# Patient Record
Sex: Male | Born: 1940 | Race: White | Hispanic: No | Marital: Married | Smoking: Former smoker
Health system: Southern US, Community
[De-identification: ages and names within clinical notes are randomized; demographics above are authoritative.]

## PROBLEM LIST (undated history)

## (undated) DIAGNOSIS — M5136 Other intervertebral disc degeneration, lumbar region: Secondary | ICD-10-CM

## (undated) DIAGNOSIS — E7800 Pure hypercholesterolemia, unspecified: Secondary | ICD-10-CM

## (undated) DIAGNOSIS — K759 Inflammatory liver disease, unspecified: Secondary | ICD-10-CM

## (undated) DIAGNOSIS — D649 Anemia, unspecified: Secondary | ICD-10-CM

## (undated) DIAGNOSIS — I10 Essential (primary) hypertension: Secondary | ICD-10-CM

## (undated) DIAGNOSIS — K922 Gastrointestinal hemorrhage, unspecified: Secondary | ICD-10-CM

## (undated) DIAGNOSIS — M51369 Other intervertebral disc degeneration, lumbar region without mention of lumbar back pain or lower extremity pain: Secondary | ICD-10-CM

## (undated) DIAGNOSIS — Z974 Presence of external hearing-aid: Secondary | ICD-10-CM

## (undated) HISTORY — PX: CHOLECYSTECTOMY: SHX55

## (undated) HISTORY — DX: Inflammatory liver disease, unspecified: K75.9

## (undated) HISTORY — DX: Essential (primary) hypertension: I10

## (undated) HISTORY — DX: Pure hypercholesterolemia, unspecified: E78.00

## (undated) HISTORY — DX: Anemia, unspecified: D64.9

---

## 2004-05-19 ENCOUNTER — Inpatient Hospital Stay: Payer: Self-pay | Admitting: Gastroenterology

## 2011-12-07 ENCOUNTER — Encounter: Payer: Self-pay | Admitting: *Deleted

## 2011-12-08 ENCOUNTER — Ambulatory Visit (INDEPENDENT_AMBULATORY_CARE_PROVIDER_SITE_OTHER): Payer: Medicare Other | Admitting: Internal Medicine

## 2011-12-08 ENCOUNTER — Encounter: Payer: Self-pay | Admitting: Internal Medicine

## 2011-12-08 VITALS — BP 143/84 | HR 69 | Temp 98.3°F | Resp 18 | Ht 69.0 in | Wt 190.0 lb

## 2011-12-08 DIAGNOSIS — E7800 Pure hypercholesterolemia, unspecified: Secondary | ICD-10-CM

## 2011-12-08 DIAGNOSIS — I10 Essential (primary) hypertension: Secondary | ICD-10-CM

## 2011-12-08 DIAGNOSIS — G4730 Sleep apnea, unspecified: Secondary | ICD-10-CM

## 2011-12-08 DIAGNOSIS — D649 Anemia, unspecified: Secondary | ICD-10-CM

## 2011-12-08 NOTE — Patient Instructions (Addendum)
It was good seeing you today.  I am glad you have been doing well.  Monitor your blood pressures for me and send them to me in 3-4 weeks.

## 2011-12-25 ENCOUNTER — Encounter: Payer: Self-pay | Admitting: Internal Medicine

## 2011-12-25 DIAGNOSIS — I10 Essential (primary) hypertension: Secondary | ICD-10-CM | POA: Insufficient documentation

## 2011-12-25 DIAGNOSIS — D649 Anemia, unspecified: Secondary | ICD-10-CM | POA: Insufficient documentation

## 2011-12-25 DIAGNOSIS — G4730 Sleep apnea, unspecified: Secondary | ICD-10-CM | POA: Insufficient documentation

## 2011-12-25 NOTE — Assessment & Plan Note (Signed)
Has had extensive GI w/up.  Will obtain records.  On no iron.  Follow cbc.  Hgb has been wnl recently.     

## 2011-12-25 NOTE — Assessment & Plan Note (Signed)
Blood pressure still elevated above goal.  Discussed adjusting his medication.  He declines.  Wants to work on diet and exercise.  Check blood pressures.   Check metabolic panel.  Follow.

## 2011-12-25 NOTE — Assessment & Plan Note (Signed)
Has CPAP.  Continue.    

## 2011-12-25 NOTE — Progress Notes (Signed)
  Subjective:    Patient ID: Jared Cooper, male    DOB: 1940/12/29, 71 y.o.   MRN: [id number]  HPI 71 year old male with past history of hypertension, anemia, reoccurring GI bleeds and hepatitis.  Comes in today for a scheduled follow up.  States he has felt good.  Blood pressures averaging 140-150/60-70s.  Not checking it recently.  Not exercising regularly and not watching what he eats.  No bleeding.  Bowels - no problems reported.  Stays active.    Past Medical History  Diagnosis Date  . Hypertension   . Anemia   . Hypercholesterolemia   . Hepatitis     diagnosed 1970, hospitalized    Outpatient Encounter Prescriptions as of 12/08/2011  Medication Sig Dispense Refill  . amLODipine (NORVASC) 5 MG tablet Take 5 mg by mouth daily.      . cyclobenzaprine (FLEXERIL) 5 MG tablet Take 5 mg by mouth 2 (two) times daily as needed. Every  eight hours as needed      . losartan-hydrochlorothiazide (HYZAAR) 50-12.5 MG per tablet Take 1 tablet by mouth daily.        Review of Systems Patient denies any headache, lightheadedness or dizziness.  No significant sinus symptoms. No chest pain, tightness or palpitations.  No increased shortness of breath, cough or congestion.  No nausea or vomiting.  No increased acid reflux.  No abdominal pain or cramping.  No bowel change, such as diarrhea, constipation, BRBPR or melana.  No urine change.        Objective:   Physical Exam Filed Vitals:   12/08/11 1131  BP: 143/84  Pulse: 69  Temp: 98.3 F (36.8 C)  Resp: 81   71 year old male in no acute distress.   HEENT:  Nares - clear.  OP- without lesions or erythema.  NECK:  Supple, nontender.  No audible bruit.   HEART:  Appears to be regular. LUNGS:  Without crackles or wheezing audible.  Respirations even and unlabored.   RADIAL PULSE:  Equal bilaterally.  ABDOMEN:  Soft, nontender.  No audible abdominal bruit.   EXTREMITIES:  No increased edema to be present.                     Assessment &  Plan:  CARDIOVASCULAR.  Asymptomatic.    MSK.  Some intermittent back issues.  Occasionally takes Flexeril.  Currently doing well.  Follow.    HEALTH MAINTENANCE.  Physical 12/21/10.  Has had extensive GI w/up.  Check PSA when due.

## 2011-12-26 ENCOUNTER — Other Ambulatory Visit: Payer: Self-pay | Admitting: General Practice

## 2011-12-26 MED ORDER — AMLODIPINE BESYLATE 5 MG PO TABS: 5.0000 mg | ORAL_TABLET | Freq: Every day | ORAL | Status: DC

## 2012-03-17 ENCOUNTER — Other Ambulatory Visit: Payer: Self-pay

## 2012-03-28 ENCOUNTER — Ambulatory Visit (INDEPENDENT_AMBULATORY_CARE_PROVIDER_SITE_OTHER): Payer: Medicare Other | Admitting: Internal Medicine

## 2012-03-28 ENCOUNTER — Encounter: Payer: Self-pay | Admitting: Internal Medicine

## 2012-03-28 VITALS — BP 130/84 | HR 61 | Temp 97.4°F | Ht 69.0 in | Wt 195.0 lb

## 2012-03-28 DIAGNOSIS — G4730 Sleep apnea, unspecified: Secondary | ICD-10-CM

## 2012-03-28 DIAGNOSIS — E7800 Pure hypercholesterolemia, unspecified: Secondary | ICD-10-CM

## 2012-03-28 DIAGNOSIS — K625 Hemorrhage of anus and rectum: Secondary | ICD-10-CM

## 2012-03-28 DIAGNOSIS — I10 Essential (primary) hypertension: Secondary | ICD-10-CM

## 2012-03-28 DIAGNOSIS — Z125 Encounter for screening for malignant neoplasm of prostate: Secondary | ICD-10-CM

## 2012-03-28 DIAGNOSIS — D649 Anemia, unspecified: Secondary | ICD-10-CM

## 2012-04-01 ENCOUNTER — Encounter: Payer: Self-pay | Admitting: Internal Medicine

## 2012-04-01 DIAGNOSIS — K625 Hemorrhage of anus and rectum: Secondary | ICD-10-CM | POA: Insufficient documentation

## 2012-04-01 NOTE — Assessment & Plan Note (Signed)
Blood pressure still elevated above goal.  Discussed adjusting his medication.  Wants to work on diet and exercise.  Check blood pressures.   Check metabolic panel.  Follow.  Rx given for Losartan/HCTZ 100/12.5. States he will start if his blood pressure remains elevated.

## 2012-04-01 NOTE — Assessment & Plan Note (Signed)
No further bleeding.  Has had an extensive GI w/up.

## 2012-04-01 NOTE — Progress Notes (Signed)
  Subjective:    Patient ID: Jared Cooper, male    DOB: 12/16/1940, 72 y.o.   MRN: [id number]  HPI 72 year old male with past history of hypertension, anemia, reoccurring GI bleeds and hepatitis.  Comes in today for a scheduled follow up.  Was scheduled for a physical exam, but he wants to post pone. States he has felt good.  Blood pressures averaging 140-150/60-70s.  Has not been exercising regularly and not watching what he eats.  Just started getting more serious about this a few days ago. No bleeding.  Bowels - no problems reported.  Stays active.    Past Medical History  Diagnosis Date  . Hypertension   . Anemia   . Hypercholesterolemia   . Hepatitis     diagnosed 1970, hospitalized    Outpatient Encounter Prescriptions as of 03/28/2012  Medication Sig Dispense Refill  . amLODipine (NORVASC) 5 MG tablet Take 1 tablet (5 mg total) by mouth daily.  90 tablet  1  . cyclobenzaprine (FLEXERIL) 5 MG tablet Take 5 mg by mouth 2 (two) times daily as needed. Every  eight hours as needed      . losartan-hydrochlorothiazide (HYZAAR) 50-12.5 MG per tablet Take 1 tablet by mouth daily.       No facility-administered encounter medications on file as of 03/28/2012.    Review of Systems Patient denies any headache, lightheadedness or dizziness.  No significant sinus symptoms. No chest pain, tightness or palpitations.  No increased shortness of breath, cough or congestion.  No nausea or vomiting.  No increased acid reflux.  No abdominal pain or cramping.  No bowel change, such as diarrhea, constipation, BRBPR or melana.  No urine change.   Had a lot of questions about vitamins.       Objective:   Physical Exam  Filed Vitals:   03/28/12 1021  BP: 130/84  Pulse: 61  Temp: 97.4 F (36.3 C)   Blood pressure recheck:  69/77  72 year old male in no acute distress.   HEENT:  Nares - clear.  OP- without lesions or erythema.  NECK:  Supple, nontender.  No audible bruit.   HEART:  Appears to  be regular. LUNGS:  Without crackles or wheezing audible.  Respirations even and unlabored.   RADIAL PULSE:  Equal bilaterally.  ABDOMEN:  Soft, nontender.  No audible abdominal bruit.   EXTREMITIES:  No increased edema to be present.                     Assessment & Plan:  CARDIOVASCULAR.  Asymptomatic.    MSK.  Some intermittent back issues.  Occasionally takes Flexeril.  Currently doing well.  Follow.    HEALTH MAINTENANCE.  Physical 12/21/10.  Has had extensive GI w/up.  Check PSA.

## 2012-04-01 NOTE — Assessment & Plan Note (Signed)
Has CPAP.  Continue.

## 2012-04-01 NOTE — Assessment & Plan Note (Signed)
Has had extensive GI w/up.  Will obtain records.  On no iron.  Follow cbc.  Hgb has been wnl recently.

## 2012-04-18 ENCOUNTER — Other Ambulatory Visit (INDEPENDENT_AMBULATORY_CARE_PROVIDER_SITE_OTHER): Payer: Medicare Other

## 2012-04-18 DIAGNOSIS — I10 Essential (primary) hypertension: Secondary | ICD-10-CM

## 2012-04-18 DIAGNOSIS — Z125 Encounter for screening for malignant neoplasm of prostate: Secondary | ICD-10-CM

## 2012-04-18 DIAGNOSIS — D649 Anemia, unspecified: Secondary | ICD-10-CM

## 2012-04-18 DIAGNOSIS — E7800 Pure hypercholesterolemia, unspecified: Secondary | ICD-10-CM

## 2012-04-18 LAB — LIPID PANEL
Cholesterol: 199 mg/dL (ref 0–200)
Triglycerides: 214 mg/dL — ABNORMAL HIGH (ref 0.0–149.0)

## 2012-04-18 LAB — COMPREHENSIVE METABOLIC PANEL
AST: 21 U/L (ref 0–37)
Albumin: 3.8 g/dL (ref 3.5–5.2)
Alkaline Phosphatase: 67 U/L (ref 39–117)
BUN: 22 mg/dL (ref 6–23)
CO2: 30 mEq/L (ref 19–32)
Calcium: 9.2 mg/dL (ref 8.4–10.5)
Glucose, Bld: 105 mg/dL — ABNORMAL HIGH (ref 70–99)
Total Protein: 6.7 g/dL (ref 6.0–8.3)

## 2012-04-18 LAB — CBC WITH DIFFERENTIAL/PLATELET
Basophils Absolute: 0 10*3/uL (ref 0.0–0.1)
HCT: 42.9 % (ref 39.0–52.0)
Hemoglobin: 14.2 g/dL (ref 13.0–17.0)
Lymphs Abs: 1.1 10*3/uL (ref 0.7–4.0)
MCHC: 33.2 g/dL (ref 30.0–36.0)
Monocytes Relative: 9.6 % (ref 3.0–12.0)
Neutro Abs: 5.2 10*3/uL (ref 1.4–7.7)
RDW: 14.7 % — ABNORMAL HIGH (ref 11.5–14.6)

## 2012-04-18 LAB — BASIC METABOLIC PANEL
Calcium: 9.2 mg/dL (ref 8.4–10.5)
Creatinine, Ser: 1.1 mg/dL (ref 0.4–1.5)
GFR: 71.96 mL/min (ref >60.00)

## 2012-04-18 LAB — TSH: TSH: 6.27 u[IU]/mL — ABNORMAL HIGH (ref 0.35–5.50)

## 2012-04-18 LAB — FERRITIN: Ferritin: 31.7 ng/mL (ref 22.0–322.0)

## 2012-04-20 ENCOUNTER — Encounter: Payer: Self-pay | Admitting: Internal Medicine

## 2012-04-20 ENCOUNTER — Telehealth: Payer: Self-pay | Admitting: Internal Medicine

## 2012-04-20 NOTE — Telephone Encounter (Signed)
Pt notified of lab results and need for follow up labs in one month.  Please schedule a non fasting lab appt in one month and call pt with time.  Thanks.

## 2012-04-20 NOTE — Telephone Encounter (Signed)
Left message on pt cell phone asking pt to call office.  Please schedule labs apponiments

## 2012-04-30 NOTE — Telephone Encounter (Signed)
Left message on home number asking pt to call office  °

## 2012-05-03 NOTE — Telephone Encounter (Signed)
Appointment 05/21/12 mailed patient appointment card

## 2012-05-04 ENCOUNTER — Telehealth: Payer: Self-pay | Admitting: Internal Medicine

## 2012-05-04 NOTE — Telephone Encounter (Signed)
Patient wants to have his labs on the day of his physical. I called the patient to see if he wants to come in early that morning to have his labs drawn and come back at 11:00 to have his physical done since his labs are fasting labs.

## 2012-05-16 ENCOUNTER — Other Ambulatory Visit (INDEPENDENT_AMBULATORY_CARE_PROVIDER_SITE_OTHER): Payer: Medicare Other

## 2012-05-16 DIAGNOSIS — E7800 Pure hypercholesterolemia, unspecified: Secondary | ICD-10-CM

## 2012-05-16 DIAGNOSIS — D649 Anemia, unspecified: Secondary | ICD-10-CM

## 2012-05-16 LAB — CBC WITH DIFFERENTIAL/PLATELET
Basophils Relative: 0.3 % (ref 0.0–3.0)
Eosinophils Absolute: 0.1 10*3/uL (ref 0.0–0.7)
Eosinophils Relative: 1.8 % (ref 0.0–5.0)
Hemoglobin: 14.9 g/dL (ref 13.0–17.0)
Lymphocytes Relative: 18.3 % (ref 12.0–46.0)
MCHC: 33.2 g/dL (ref 30.0–36.0)
Neutro Abs: 5 10*3/uL (ref 1.4–7.7)
RBC: 4.95 Mil/uL (ref 4.22–5.81)
WBC: 7.4 10*3/uL (ref 4.5–10.5)

## 2012-05-16 LAB — LDL CHOLESTEROL, DIRECT: Direct LDL: 106.5 mg/dL

## 2012-05-16 LAB — LIPID PANEL
HDL: 29.6 mg/dL — ABNORMAL LOW (ref >39.00)
Total CHOL/HDL Ratio: 6
VLDL: 48 mg/dL — ABNORMAL HIGH (ref 0.0–40.0)

## 2012-05-16 NOTE — Telephone Encounter (Signed)
Pt had labs done today.

## 2012-05-17 ENCOUNTER — Telehealth: Payer: Self-pay | Admitting: Internal Medicine

## 2012-05-17 ENCOUNTER — Ambulatory Visit (INDEPENDENT_AMBULATORY_CARE_PROVIDER_SITE_OTHER): Payer: Medicare Other | Admitting: Internal Medicine

## 2012-05-17 ENCOUNTER — Encounter: Payer: Self-pay | Admitting: Internal Medicine

## 2012-05-17 VITALS — BP 130/70 | HR 74 | Temp 98.3°F | Ht 69.0 in | Wt 194.2 lb

## 2012-05-17 DIAGNOSIS — M545 Low back pain, unspecified: Secondary | ICD-10-CM

## 2012-05-17 DIAGNOSIS — I10 Essential (primary) hypertension: Secondary | ICD-10-CM

## 2012-05-17 MED ORDER — METHOCARBAMOL 500 MG PO TABS: 500.0000 mg | ORAL_TABLET | Freq: Three times a day (TID) | ORAL | Status: DC

## 2012-05-17 MED ORDER — HYDROCODONE-ACETAMINOPHEN 5-325MG PREPACK (~~LOC~~: ORAL_TABLET | ORAL | Status: DC

## 2012-05-17 MED ORDER — CYCLOBENZAPRINE HCL 5 MG PO TABS: 5.0000 mg | ORAL_TABLET | Freq: Three times a day (TID) | ORAL | Status: AC | PRN

## 2012-05-17 NOTE — Progress Notes (Signed)
Subjective:    Patient ID: Jared Cooper, male    DOB: 1940-06-14, 72 y.o.   MRN: [id number]  Back Pain  72 year old male with past history of hypertension, anemia, reoccurring GI bleeds and hepatitis.  Comes in today as a work in with concerns regarding back pain.  States he has been working out in the yard.  No known injury or trauma.  Woke Sunday am and turned over in bed.  "Turned wrong".  Increased pain.  Lower back.  No radiation of pain down leg.  No numbness or tingling.  Has had intermittent back pain.  This feels the same as his previous flares.  Has been taking tylenol #3 and flexeril.  Saw chiropractor yesterday.  Pain is some better now, but still with increased pain.  No other symptoms.  No sob. No abdominal pain.  No bowel change.    Past Medical History  Diagnosis Date  . Hypertension   . Anemia   . Hypercholesterolemia   . Hepatitis     diagnosed 1970, hospitalized    Outpatient Encounter Prescriptions as of 05/17/2012  Medication Sig Dispense Refill  . amLODipine (NORVASC) 5 MG tablet Take 1 tablet (5 mg total) by mouth daily.  90 tablet  1  . losartan-hydrochlorothiazide (HYZAAR) 50-12.5 MG per tablet Take 1 tablet by mouth daily.      Marland Kitchen acetaminophen-codeine (TYLENOL #3) 300-30 MG per tablet Take 1 tablet by mouth every 4 (four) hours as needed for pain.      . cyclobenzaprine (FLEXERIL) 5 MG tablet Take 5 mg by mouth 2 (two) times daily as needed. Every  eight hours as needed      . HYDROcodone-acetaminophen (VICODIN) 5-325 mg TABS One tablet q 6 hours prn  30 tablet  0  . methocarbamol (ROBAXIN) 500 MG tablet Take 1 tablet (500 mg total) by mouth 3 (three) times daily.  30 tablet  0   No facility-administered encounter medications on file as of 05/17/2012.    Review of Systems  Musculoskeletal: Positive for back pain.  Patient denies any headache, lightheadedness or dizziness. No chest pain.  No increased shortness of breath,  No nausea or vomiting.  No  increased acid reflux.  No abdominal pain or cramping.  No bowel change, such as diarrhea, constipation, BRBPR or melana.  No urine change.  Back pain as outlined.  No radiation of pain.        Objective:   Physical Exam  Filed Vitals:   05/17/12 0821  BP: 130/70  Pulse: 74  Temp: 98.3 F (61.77 C)   72 year old male in no acute distress.   NECK:  Supple, nontender.  HEART:  Appears to be regular. LUNGS:  Without crackles or wheezing audible.  Respirations even and unlabored.   RADIAL PULSE:  Equal bilaterally.  ABDOMEN:  Soft, nontender.  No audible abdominal bruit.  BACK:  No pain to palpation.  Increased pain with going form sitting to lying position.  No significant pain with straight leg raise.  Increased pain with going from sitting to standing.    EXTREMITIES:  No increased edema to be present.                     Assessment & Plan:  BACK PAIN.  Symptoms and exam as outlined.  Same pain as previous flares.  Will avoid increased antiinflammatories.  Norco 5/325 q 8 hours prn.  Flexeril 5mg  tid prn.  Follow.  Notify me  if symptoms persist.  Wants to hold on xray at this time.  Follow.   CARDIOVASCULAR.  Asymptomatic.    HEALTH MAINTENANCE.  Physical 12/21/10.  Has had extensive GI w/up.

## 2012-05-17 NOTE — Telephone Encounter (Signed)
rx changed to flexeril instead of robaxin - due to insurance coverage

## 2012-05-17 NOTE — Telephone Encounter (Signed)
Patient wanting flexeril instead of the Robaxin 500 mg that is requiring a prior authorization. He is wanting this prescription today.

## 2012-05-17 NOTE — Telephone Encounter (Signed)
?   Okay to change to Flexeril

## 2012-05-19 ENCOUNTER — Encounter: Payer: Self-pay | Admitting: Internal Medicine

## 2012-05-19 NOTE — Assessment & Plan Note (Signed)
Blood pressure as outlined.  Follow.  Same medication regimen.   

## 2012-05-21 ENCOUNTER — Other Ambulatory Visit: Payer: Medicare Other

## 2012-05-23 ENCOUNTER — Telehealth: Payer: Self-pay | Admitting: *Deleted

## 2012-05-23 NOTE — Telephone Encounter (Signed)
Called [phone] for prior authorization on the Methocarbamol 500 mg tablet, form is being faxed over now  [facility]

## 2012-05-30 ENCOUNTER — Other Ambulatory Visit: Payer: Self-pay | Admitting: *Deleted

## 2012-05-30 MED ORDER — AMLODIPINE BESYLATE 5 MG PO TABS: 5.0000 mg | ORAL_TABLET | Freq: Every day | ORAL | Status: DC

## 2012-06-04 ENCOUNTER — Other Ambulatory Visit: Payer: Self-pay | Admitting: *Deleted

## 2012-06-04 MED ORDER — AMLODIPINE BESYLATE 5 MG PO TABS: 5.0000 mg | ORAL_TABLET | Freq: Every day | ORAL | Status: AC

## 2012-06-06 ENCOUNTER — Other Ambulatory Visit: Payer: Self-pay | Admitting: Internal Medicine

## 2012-06-06 DIAGNOSIS — R7989 Other specified abnormal findings of blood chemistry: Secondary | ICD-10-CM

## 2012-06-06 NOTE — Progress Notes (Signed)
Ordered labs

## 2012-06-07 ENCOUNTER — Other Ambulatory Visit (INDEPENDENT_AMBULATORY_CARE_PROVIDER_SITE_OTHER): Payer: Medicare Other

## 2012-06-07 ENCOUNTER — Encounter: Payer: Self-pay | Admitting: *Deleted

## 2012-06-07 DIAGNOSIS — R7989 Other specified abnormal findings of blood chemistry: Secondary | ICD-10-CM

## 2012-06-07 LAB — TSH: TSH: 1.95 u[IU]/mL (ref 0.35–5.50)

## 2012-07-02 ENCOUNTER — Encounter: Payer: Medicare Other | Admitting: Internal Medicine

## 2012-09-05 ENCOUNTER — Other Ambulatory Visit: Payer: Self-pay

## 2012-12-06 ENCOUNTER — Other Ambulatory Visit: Payer: Self-pay

## 2020-08-21 ENCOUNTER — Other Ambulatory Visit (HOSPITAL_COMMUNITY): Payer: Self-pay | Admitting: Orthopedic Surgery

## 2020-08-21 ENCOUNTER — Other Ambulatory Visit: Payer: Self-pay | Admitting: Orthopedic Surgery

## 2020-08-21 DIAGNOSIS — M25562 Pain in left knee: Secondary | ICD-10-CM

## 2020-09-01 ENCOUNTER — Other Ambulatory Visit: Payer: Self-pay

## 2020-09-01 ENCOUNTER — Ambulatory Visit
Admission: RE | Admit: 2020-09-01 | Discharge: 2020-09-01 | Disposition: A | Discharge status: Home / Self Care | Payer: Medicare HMO | Source: Ambulatory Visit | Attending: Orthopedic Surgery | Admitting: Orthopedic Surgery

## 2020-09-01 DIAGNOSIS — M25562 Pain in left knee: Secondary | ICD-10-CM | POA: Diagnosis present

## 2020-11-16 ENCOUNTER — Encounter: Payer: Self-pay | Admitting: Orthopedic Surgery

## 2020-11-26 ENCOUNTER — Other Ambulatory Visit: Payer: Self-pay | Admitting: Orthopedic Surgery

## 2020-12-01 ENCOUNTER — Ambulatory Visit
Admission: RE | Admit: 2020-12-01 | Discharge: 2020-12-01 | Disposition: A | Discharge status: Home / Self Care | Payer: Medicare HMO | Attending: Orthopedic Surgery | Admitting: Orthopedic Surgery

## 2020-12-01 ENCOUNTER — Ambulatory Visit: Payer: Medicare HMO | Admitting: Anesthesiology

## 2020-12-01 ENCOUNTER — Other Ambulatory Visit: Payer: Self-pay

## 2020-12-01 ENCOUNTER — Encounter: Payer: Self-pay | Admitting: Orthopedic Surgery

## 2020-12-01 ENCOUNTER — Ambulatory Visit: Payer: Medicare HMO | Attending: Orthopedic Surgery

## 2020-12-01 ENCOUNTER — Encounter
Admission: RE | Disposition: A | Discharge status: Home / Self Care | Payer: Self-pay | Source: Home / Self Care | Attending: Orthopedic Surgery

## 2020-12-01 DIAGNOSIS — M23301 Other meniscus derangements, unspecified lateral meniscus, left knee: Secondary | ICD-10-CM | POA: Diagnosis not present

## 2020-12-01 DIAGNOSIS — M8448XA Pathological fracture, other site, initial encounter for fracture: Secondary | ICD-10-CM | POA: Diagnosis not present

## 2020-12-01 DIAGNOSIS — M23222 Derangement of posterior horn of medial meniscus due to old tear or injury, left knee: Secondary | ICD-10-CM | POA: Diagnosis not present

## 2020-12-01 DIAGNOSIS — Z87891 Personal history of nicotine dependence: Secondary | ICD-10-CM | POA: Diagnosis not present

## 2020-12-01 DIAGNOSIS — Z886 Allergy status to analgesic agent status: Secondary | ICD-10-CM | POA: Diagnosis not present

## 2020-12-01 DIAGNOSIS — Z79899 Other long term (current) drug therapy: Secondary | ICD-10-CM | POA: Diagnosis not present

## 2020-12-01 DIAGNOSIS — M23322 Other meniscus derangements, posterior horn of medial meniscus, left knee: Secondary | ICD-10-CM | POA: Diagnosis present

## 2020-12-01 DIAGNOSIS — Z888 Allergy status to other drugs, medicaments and biological substances status: Secondary | ICD-10-CM | POA: Diagnosis not present

## 2020-12-01 DIAGNOSIS — M23201 Derangement of unspecified lateral meniscus due to old tear or injury, left knee: Secondary | ICD-10-CM | POA: Diagnosis not present

## 2020-12-01 DIAGNOSIS — I10 Essential (primary) hypertension: Secondary | ICD-10-CM | POA: Insufficient documentation

## 2020-12-01 DIAGNOSIS — M1712 Unilateral primary osteoarthritis, left knee: Secondary | ICD-10-CM | POA: Diagnosis not present

## 2020-12-01 HISTORY — DX: Other intervertebral disc degeneration, lumbar region: M51.36

## 2020-12-01 HISTORY — DX: Presence of external hearing-aid: Z97.4

## 2020-12-01 HISTORY — DX: Gastrointestinal hemorrhage, unspecified: K92.2

## 2020-12-01 HISTORY — PX: KNEE ARTHROSCOPY WITH MEDIAL MENISECTOMY: SHX5651

## 2020-12-01 HISTORY — DX: Other intervertebral disc degeneration, lumbar region without mention of lumbar back pain or lower extremity pain: M51.369

## 2020-12-01 SURGERY — ARTHROSCOPY, KNEE, WITH MEDIAL MENISCECTOMY
Anesthesia: General | Site: Knee | Laterality: Left

## 2020-12-01 MED ORDER — ACETAMINOPHEN 500 MG PO TABS: 1000.0000 mg | ORAL_TABLET | Freq: Three times a day (TID) | ORAL | 2 refills | Status: AC

## 2020-12-01 MED ORDER — PROPOFOL 10 MG/ML IV BOLUS
INTRAVENOUS | Status: DC | PRN
  Administered 2020-12-01: 150 mg via INTRAVENOUS

## 2020-12-01 MED ORDER — LACTATED RINGERS IV SOLN: INTRAVENOUS | Status: DC

## 2020-12-01 MED ORDER — CEFAZOLIN SODIUM-DEXTROSE 2-4 GM/100ML-% IV SOLN
2.0000 g | INTRAVENOUS | Status: AC
  Administered 2020-12-01: 2 g via INTRAVENOUS

## 2020-12-01 MED ORDER — LIDOCAINE-EPINEPHRINE 1 %-1:100000 IJ SOLN
INTRAMUSCULAR | Status: DC | PRN
  Administered 2020-12-01: 19 mL via INTRAMUSCULAR

## 2020-12-01 MED ORDER — MIDAZOLAM HCL 5 MG/5ML IJ SOLN
INTRAMUSCULAR | Status: DC | PRN
  Administered 2020-12-01 (×2): 1 mg via INTRAVENOUS

## 2020-12-01 MED ORDER — LACTATED RINGERS IR SOLN
Status: DC | PRN
  Administered 2020-12-01: 12000 mL

## 2020-12-01 MED ORDER — LIDOCAINE HCL (CARDIAC) PF 100 MG/5ML IV SOSY
PREFILLED_SYRINGE | INTRAVENOUS | Status: DC | PRN
  Administered 2020-12-01: 30 mg via INTRATRACHEAL

## 2020-12-01 MED ORDER — FENTANYL CITRATE (PF) 100 MCG/2ML IJ SOLN
INTRAMUSCULAR | Status: DC | PRN
  Administered 2020-12-01: 25 ug via INTRAVENOUS
  Administered 2020-12-01: 50 ug via INTRAVENOUS
  Administered 2020-12-01: 25 ug via INTRAVENOUS

## 2020-12-01 MED ORDER — OXYCODONE HCL 5 MG/5ML PO SOLN
5.0000 mg | Freq: Once | ORAL | Status: AC | PRN
Start: 2020-12-01 — End: 2020-12-01

## 2020-12-01 MED ORDER — ONDANSETRON 4 MG PO TBDP: 4.0000 mg | ORAL_TABLET | Freq: Three times a day (TID) | ORAL | 0 refills | Status: AC | PRN

## 2020-12-01 MED ORDER — HYDROCODONE-ACETAMINOPHEN 5-325 MG PO TABS: 1.0000 | ORAL_TABLET | ORAL | 0 refills | Status: AC | PRN

## 2020-12-01 MED ORDER — DEXAMETHASONE SODIUM PHOSPHATE 4 MG/ML IJ SOLN
INTRAMUSCULAR | Status: DC | PRN
  Administered 2020-12-01: 4 mg via INTRAVENOUS

## 2020-12-01 MED ORDER — ONDANSETRON HCL 4 MG/2ML IJ SOLN
INTRAMUSCULAR | Status: DC | PRN
  Administered 2020-12-01: 4 mg via INTRAVENOUS

## 2020-12-01 MED ORDER — OXYCODONE HCL 5 MG PO TABS
5.0000 mg | ORAL_TABLET | Freq: Once | ORAL | Status: AC | PRN
  Administered 2020-12-01: 5 mg via ORAL

## 2020-12-01 SURGICAL SUPPLY — 42 items
ADAPTER IRRIG TUBE 2 SPIKE SOL (ADAPTER) ×2 IMPLANT
ADPR TBG 2 SPK PMP STRL ASCP (ADAPTER) ×1
APL PRP STRL LF DISP 70% ISPRP (MISCELLANEOUS) ×1
BLADE SHAVER 4.5X7 STR FR (MISCELLANEOUS) ×2 IMPLANT
BLADE SURG SZ11 CARB STEEL (BLADE) ×2 IMPLANT
BNDG COHESIVE 4X5 TAN ST LF (GAUZE/BANDAGES/DRESSINGS) ×2 IMPLANT
BNDG ESMARK 6X12 TAN STRL LF (GAUZE/BANDAGES/DRESSINGS) ×2 IMPLANT
BUR RADIUS 4.0X18.5 (BURR) ×2 IMPLANT
CHLORAPREP W/TINT 26 (MISCELLANEOUS) ×2 IMPLANT
COOLER POLAR GLACIER W/PUMP (MISCELLANEOUS) ×2 IMPLANT
COVER LIGHT HANDLE UNIVERSAL (MISCELLANEOUS) ×4 IMPLANT
CUFF TOURN SGL QUICK 30 (TOURNIQUET CUFF)
CUFF TRNQT CYL 30X4X21-28X (TOURNIQUET CUFF) IMPLANT
DRAPE BILATERAL LIMB T (DRAPES) ×2 IMPLANT
DRAPE IMP U-DRAPE 54X76 (DRAPES) ×2 IMPLANT
DRSG TEGADERM 6X8 (GAUZE/BANDAGES/DRESSINGS) ×2 IMPLANT
GAUZE SPONGE 4X4 12PLY STRL (GAUZE/BANDAGES/DRESSINGS) ×2 IMPLANT
GLOVE SRG 8 PF TXTR STRL LF DI (GLOVE) ×1 IMPLANT
GLOVE SURG ENC MOIS LTX SZ7.5 (GLOVE) ×2 IMPLANT
GLOVE SURG UNDER POLY LF SZ8 (GLOVE) ×2
GOWN STRL REUS W/ TWL LRG LVL3 (GOWN DISPOSABLE) ×1 IMPLANT
GOWN STRL REUS W/TWL LRG LVL3 (GOWN DISPOSABLE) ×2
GOWN STRL REUS W/TWL XL LVL3 (GOWN DISPOSABLE) ×2 IMPLANT
IV LACTATED RINGER IRRG 3000ML (IV SOLUTION) ×8
IV LR IRRIG 3000ML ARTHROMATIC (IV SOLUTION) ×4 IMPLANT
KIT ACCUFILL 5CC (Knees) ×2 IMPLANT
KIT KNEE SCP 414.502 (Knees) ×4 IMPLANT
KIT TURNOVER KIT A (KITS) ×2 IMPLANT
MANIFOLD NEPTUNE II (INSTRUMENTS) ×2 IMPLANT
MAT ABSORB  FLUID 56X50 GRAY (MISCELLANEOUS) ×1
MAT ABSORB FLUID 56X50 GRAY (MISCELLANEOUS) ×1 IMPLANT
PACK ARTHROSCOPY KNEE (MISCELLANEOUS) ×2 IMPLANT
PAD ABD DERMACEA PRESS 5X9 (GAUZE/BANDAGES/DRESSINGS) ×2 IMPLANT
PAD WRAPON POLAR KNEE (MISCELLANEOUS) ×2 IMPLANT
SPONGE T-LAP 18X18 ~~LOC~~+RFID (SPONGE) ×2 IMPLANT
SUT ETHILON 3-0 FS-10 30 BLK (SUTURE) ×2
SUTURE EHLN 3-0 FS-10 30 BLK (SUTURE) ×1 IMPLANT
TOWEL OR 17X26 4PK STRL BLUE (TOWEL DISPOSABLE) ×4 IMPLANT
TUBING INFLOW SET DBFLO PUMP (TUBING) ×2 IMPLANT
TUBING OUTFLOW SET DBLFO PUMP (TUBING) ×2 IMPLANT
WAND WEREWOLF FLOW 90D (MISCELLANEOUS) ×2 IMPLANT
WRAPON POLAR PAD KNEE (MISCELLANEOUS) ×4

## 2020-12-01 NOTE — Anesthesia Preprocedure Evaluation (Signed)
Anesthesia Evaluation  Patient identified by MRN, date of birth, ID band Patient awake    Reviewed: NPO status   History of Anesthesia Complications Negative for: history of anesthetic complications  Airway Mallampati: II  TM Distance: >3 FB Neck ROM: full    Dental no notable dental hx.    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;     Reproductive/Obstetrics                             Anesthesia Physical Anesthesia Plan  ASA: 2  Anesthesia Plan: General   Post-op Pain Management:    Induction:   PONV Risk Score and Plan: 2 and Midazolam and Ondansetron  Airway Management Planned:   Additional Equipment:   Intra-op Plan:   Post-operative Plan:   Informed Consent: I have reviewed the patients History and Physical, chart, labs and discussed the procedure including the risks, benefits and alternatives for the proposed anesthesia with the patient or authorized representative who has indicated his/her understanding and acceptance.       Plan Discussed with: CRNA  Anesthesia Plan Comments:         Anesthesia Quick Evaluation

## 2020-12-01 NOTE — Transfer of Care (Signed)
Immediate Anesthesia Transfer of Care Note  Patient: Jared Cooper  Procedure(s) Performed: Left knee arthroscopic loose body removal, partial medial and lateral  meniscectomy, subchondroplasty of the medial femoral condyle and tibial plateau (Left: Knee)  Patient Location: PACU  Anesthesia Type: General  Level of Consciousness: awake, alert  and patient cooperative  Airway and Oxygen Therapy: Patient Spontanous Breathing and Patient connected to supplemental oxygen  Post-op Assessment: Post-op Vital signs reviewed, Patient's Cardiovascular Status Stable, Respiratory Function Stable, Patent Airway and No signs of Nausea or vomiting  Post-op Vital Signs: Reviewed and stable  Complications: No notable events documented.

## 2020-12-01 NOTE — Anesthesia Procedure Notes (Signed)
Procedure Name: LMA Insertion Date/Time: 12/01/2020 12:41 PM Performed by: Maree Krabbe, CRNA Pre-anesthesia Checklist: Patient identified, Emergency Drugs available, Suction available, Timeout performed and Patient being monitored Patient Re-evaluated:Patient Re-evaluated prior to induction Oxygen Delivery Method: Circle system utilized Preoxygenation: Pre-oxygenation with 100% oxygen Induction Type: IV induction LMA: LMA inserted LMA Size: 4.0 Number of attempts: 1 Placement Confirmation: positive ETCO2 and breath sounds checked- equal and bilateral Tube secured with: Tape Dental Injury: Teeth and Oropharynx as per pre-operative assessment

## 2020-12-01 NOTE — Discharge Instructions (Addendum)
Arthroscopic Knee Surgery - Partial Meniscectomy   Post-Op Instructions   1. Bracing or crutches: Crutches will be provided at the time of discharge from the surgery center if you do not already have them.   2. Ice: You may be provided with a device [facility]) that allows you to ice the affected area effectively. Otherwise you can ice manually.    3. Driving:  Plan on not driving for at least two weeks. Please note that you are advised NOT to drive while taking narcotic pain medications as you may be impaired and unsafe to drive.   4. Activity: Ankle pumps several times an hour while awake to prevent blood clots. Weight bearing: as tolerated. Use crutches for as needed (usually ~1 week or less) until pain allows you to ambulate without a limp. Bending and straightening the knee is unlimited. Elevate knee above heart level as much as possible for one week. Avoid standing more than 5 minutes (consecutively) for the first week.  Avoid long distance travel for 2 weeks.  5. Medications:  - You have been provided a prescription for narcotic pain medicine. After surgery, take 1-2 narcotic tablets every 4 hours if needed for severe pain.  - You may take up to 3000mg /day of tylenol (acetaminophen). You can take 1000mg  3x/day. Please check your narcotic. If you have acetaminophen in your narcotic (each tablet will be 325mg ), be careful not to exceed a total of 3000mg /day of acetaminophen.  - A prescription for anti-nausea medication will be provided in case the narcotic medicine or anesthesia causes nausea - take 1 tablet every 6 hours only if nauseated.     6. Bandages: The physical therapist should change the bandages at the first post-op appointment. If needed, the dressing supplies have been provided to you.   7. Physical Therapy: 1-2 times per week for 6 weeks. Therapy typically starts on post operative Day 3 or 4. You have been provided an order for physical therapy. The therapist will provide  home exercises.   8. Work: May return to full work usually around 2 weeks after 1st post-operative visit. May do light duty/desk job in approximately 1-2 weeks when off of narcotics, pain is well-controlled, and swelling has decreased. Labor intensive jobs may require 4-6 weeks to return.      9. Post-Op Appointments: Your first post-op appointment will be with Dr. in approximately 2 weeks time.    If you find that they have not been scheduled please call the [facility] front desk at [phone].

## 2020-12-01 NOTE — H&P (Signed)
Paper H&P to be scanned into permanent record. H&P reviewed. No significant changes noted.  

## 2020-12-01 NOTE — Anesthesia Postprocedure Evaluation (Signed)
Anesthesia Post Note  Patient: Adel Burch  Procedure(s) Performed: Left knee arthroscopic loose body removal, partial medial and lateral  meniscectomy, subchondroplasty of the medial femoral condyle and tibial plateau (Left: Knee)     Patient location during evaluation: PACU Anesthesia Type: General Level of consciousness: awake and alert Pain management: pain level controlled Vital Signs Assessment: post-procedure vital signs reviewed and stable Respiratory status: spontaneous breathing, nonlabored ventilation, respiratory function stable and patient connected to nasal cannula oxygen Cardiovascular status: blood pressure returned to baseline and stable Postop Assessment: no apparent nausea or vomiting Anesthetic complications: no   No notable events documented.  Orrin Brigham

## 2020-12-01 NOTE — Op Note (Signed)
Operative Note    SURGERY DATE: 12/01/2020   PRE-OP DIAGNOSIS:  1.  Left subchondral insufficiency fracture of medial tibial plateau 2.  Left subchondral insufficiency fracture of medial femoral condyle 3.  Left medial meniscus tear and lateral meniscus tear 4.  Left tricompartmental degenerative changes   POST-OP DIAGNOSIS:  1.  Left subchondral insufficiency fracture of medial tibial plateau 2.  Left subchondral insufficiency fracture of medial femoral condyle 3.  Left medial meniscus tear 4.  Left tricompartmental degenerative changes   PROCEDURES:  1. Left knee arthroscopically assisted subchondroplasty of medial tibial plateau and medial femoral condyle (treatment of medial tibial plateau fracture and medial femoral condyle fracture) 2. Left knee arthroscopy, partial medial AND lateral meniscectomy 3. Left knee chondroplasty of medial and patellofemoral compartments   SURGEON: Rosealee Albee, MD   ANESTHESIA: Gen   ESTIMATED BLOOD LOSS: minimal   TOTAL IV FLUIDS: per anesthesia   INDICATION(S):  Jared Cooper is a 80 y.o. male with signs and symptoms as well as MRI finding of significant bone marrow edema and subchondral insufficiency fractures of the medial tibial plateau and medial femoral condyle in the setting of moderate degenerative changes of the medial compartment.  Additionally MRI showed a medial meniscus tear and lateral meniscus tear.  The patient underwent a trial of nonsurgical management without improvement in symptoms.  Symptoms included a combination of pain as well as significant mechanical symptoms of catching and clicking, especially around the medial aspect of the knee.  After discussion of risks, benefits, and alternatives to surgery, the patient elected to proceed.  The patient understands that he may still end up needing an arthroplasty type procedure in the future.   OPERATIVE FINDINGS:    Examination under anesthesia: A careful examination under  anesthesia was performed.  Passive range of motion was: Hyperextension: 2.  Extension: 0.  Flexion: 130.  Lachman: normal. Pivot Shift: normal.  Posterior drawer: normal.  Varus stability in full extension: normal.  Varus stability in 30 degrees of flexion: normal.  Valgus stability in full extension: normal.  Valgus stability in 30 degrees of flexion: normal.   Intra-operative findings: A thorough arthroscopic examination of the knee was performed.  The findings are: 1. Suprapatellar pouch: Normal 2. Undersurface of median ridge: Grade 1-2 degenerative changes 3. Medial patellar facet: Grade 1 changes 4. Lateral patellar facet: Grade 3 degenerative changes adjacent to the median ridge, measuring approximately 8 x 12 mm 5. Trochlea: Normal 6. Lateral gutter/popliteus tendon: Normal 7. Hoffa's fat pad: Severe inflammation 8. Medial gutter/plica: Normal 9. ACL: Normal 10. PCL: Normal 11. Medial meniscus: Complex tear with horizontal tear of the posterior horn with a radial component near the meniscus root.  The radial component was essentially a parrot-beak type tear with a flipped meniscus fragment within the intercondylar notch 12. Medial compartment cartilage: Significant areas of grade 4 degenerative changes to the medial femoral condyle with surrounding grade 2-3 changes.  Significant degenerative change about 2-3 degenerative changes measuring approximately 15 x 15 mm degenerative areas about the rim of the tibial plateau medially and anteriorly with surrounding grade 2-3 degenerative changes. 13. Lateral meniscus: Small tear of the meniscus body involving approximately 25% of the meniscus width 14. Lateral compartment cartilage: Grade 1 degenerative changes to the lateral femoral condyle and focal area of grade 3 degenerative changes to the central tibial plateau   OPERATIVE REPORT:     I identified Jared Cooper in the pre-operative holding area. I marked the operative knee with  my  initials. I reviewed the risks and benefits of the proposed surgical intervention and the patient (and/or patient's guardian) wished to proceed.  The patient underwent regional anesthesia in the preoperative holding area.  The patient was transferred to the operative suite and placed in the supine position with all bony prominences padded.  Anesthesia was administered. Appropriate IV antibiotics were administered prior to incision. The extremity was then prepped and draped in standard fashion. A time out was performed confirming the correct extremity, correct patient, and correct procedure.   Arthroscopy portals were marked. Local anesthetic was injected to the planned portal sites. The anterolateral portal was established with an 11 blade. The arthroscope was placed in the anterolateral portal and then into the suprapatellar pouch.  A diagnostic knee scope was completed with the above findings. The medial and lateral meniscus tears were identified.   Next the medial portal was established under needle localization.  The MCL was pie-crusted to improve visualization of the posterior horn. The meniscal tear was debrided using an arthroscopic biter and an oscillating shaver until the meniscus had stable borders.  Similarly, the lateral meniscus was debrided using an oscillating shaver until the meniscus had stable borders.  A chondroplasty was performed of the medial compartment and patellofemoral compartment such that there were stable cartilage edges without any loose fragments of cartilage.  There was also severe synovitis about Hoffa's fat pad.  This was debrided with an oscillating shaver and an ArthroCare wand.  The arthroscope was withdrawn from the joint.   Using fluoroscopic guidance and correlation with the patient's MRI, a small stab incision was made over the medial femoral condyle.  A trocar with cannula was drilled into the site of the subchondral insufficiency fracture on the medial femoral condyle  such that all of the flutes were within bone.  This was similarly repeated for the medial tibial plateau subchondral insufficiency fracture.  Calcium phosphate mixture was appropriately mixed and 2 cc of calcium phosphate were injected through the cannula into the medial femoral condyle subchondral insufficiency fracture.  Appropriate filling was seen on fluoroscopy.  This was then repeated for the medial tibial plateau subchondral insufficiency fracture with 1cc of calcium phosphate.   The arthroscope was placed back into the joint.  There was no extravasation of calcium phosphate material into the knee joint.  Local anesthetic was injected about the medial femoral condyle and medial tibial plateau injection sites, the arthroscopy portals, and into the knee joint itself.   The incisions were closed with 3-0 Nylon suture. Sterile dressings included Xeroform, 4x4s, Sof-Rol, and Bias wrap. A Polarcare was placed.  The patient was then awakened and taken to the PACU hemodynamically stable without complication.     POSTOPERATIVE PLAN: The patient will be discharged home today once they meet PACU criteria.  No aspirin for for DVT prophylaxis due to history of GI bleed.  Physical therapy will start on POD#3-4. Weight-bearing as tolerated. Follow up in 2 weeks per protocol.

## 2020-12-02 ENCOUNTER — Encounter: Payer: Self-pay | Admitting: Orthopedic Surgery

## 2022-01-29 IMAGING — MR MR KNEE*L* W/O CM
6 series · 40 of 40 positions shown · non-contrast
Comparison: None.

CLINICAL DATA: Left knee pain and swelling since December 2020. No
known injury.

EXAM:
MRI OF THE LEFT KNEE WITHOUT CONTRAST
TECHNIQUE: Multiplanar, multisequence MR imaging of the knee was performed. No
intravenous contrast was administered.

[Series 3: T2 fat-sat · axial · 4.0mm · 0.53mm/px · z∈[-83,+87]mm · 8 of 35 slices shown (1 of 3)]
[im 1/35]
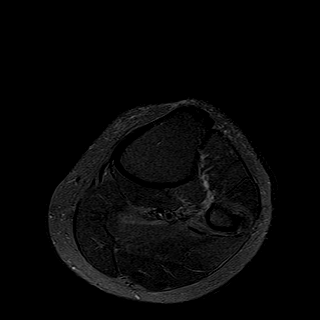
[im 5/35]
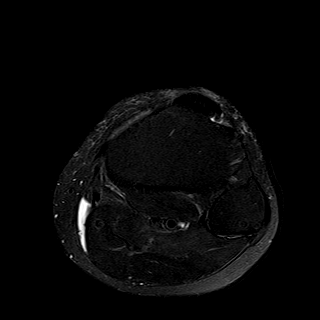
[im 10/35]
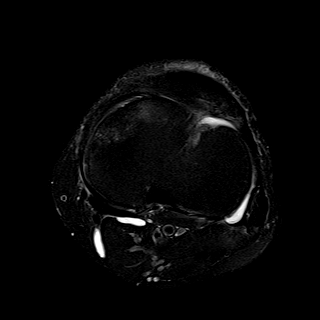
[im 15/35]
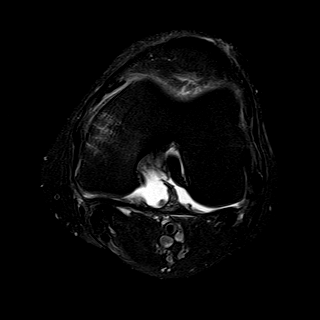
[im 20/35]
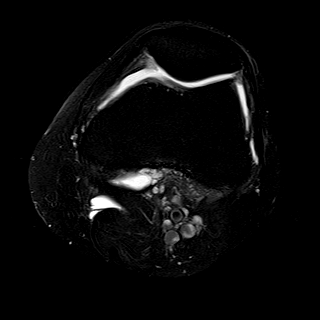
[im 25/35]
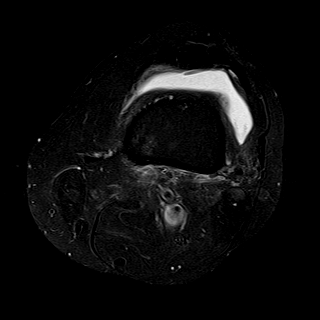
[im 30/35]
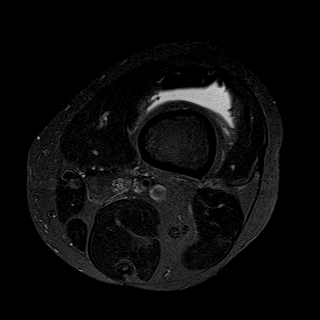
[im 35/35]
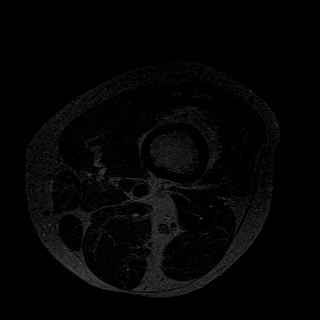

[Series 4: T1 · coronal · 4.0mm · 0.62mm/px · 6 of 31 slices shown]
[im 1/31]
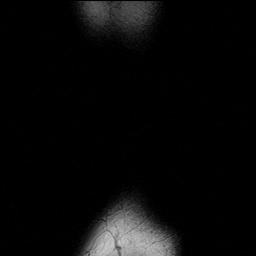
[im 7/31]
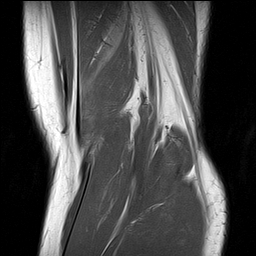
[im 13/31]
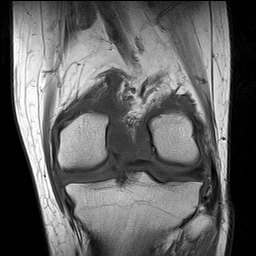
[im 19/31]
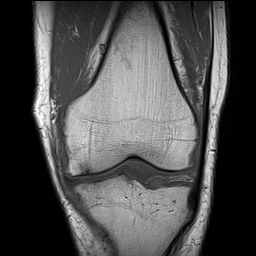
[im 25/31]
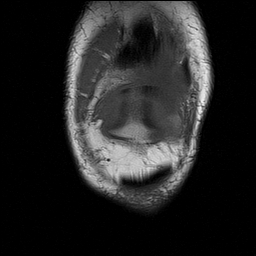
[im 31/31]
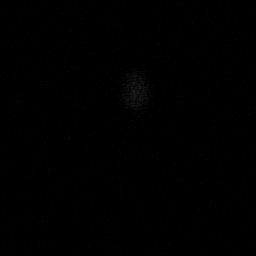

[Series 5: PD fat-sat · sagittal · 3.0mm · 0.62mm/px · 7 of 35 slices shown (1 of 2)]
[im 1/35]
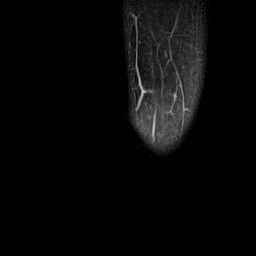
[im 6/35]
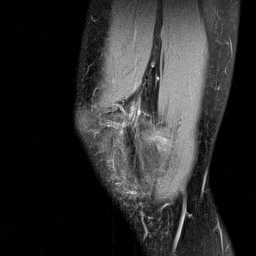
[im 12/35]
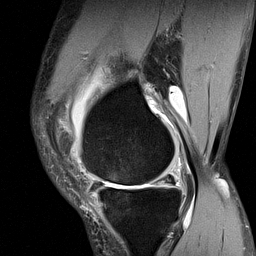
[im 18/35]
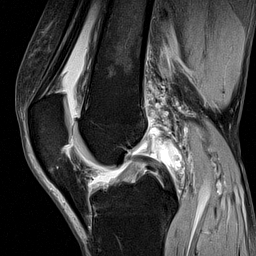
[im 23/35]
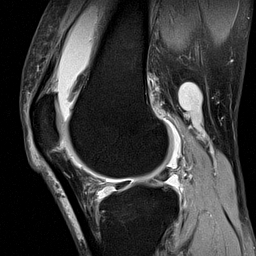
[im 29/35]
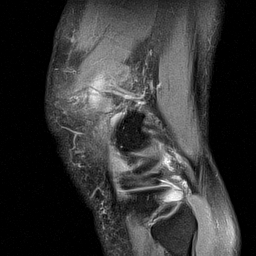
[im 35/35]
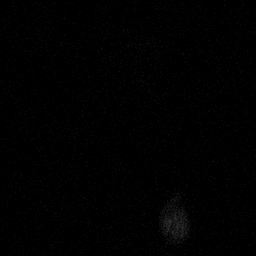

[Series 6: T2 fat-sat · coronal · 4.0mm · 0.62mm/px · 6 of 31 slices shown (2 of 3)]
[im 1/31]
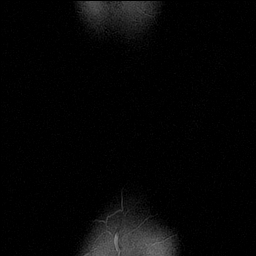
[im 7/31]
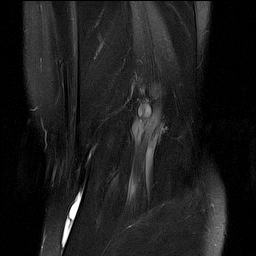
[im 13/31]
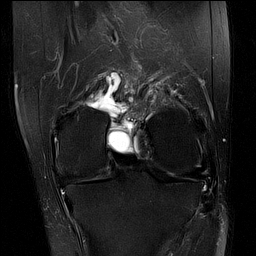
[im 19/31]
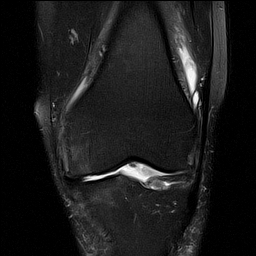
[im 25/31]
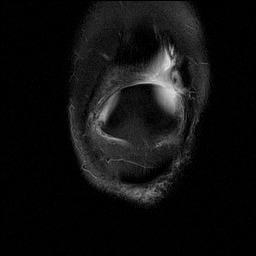
[im 31/31]
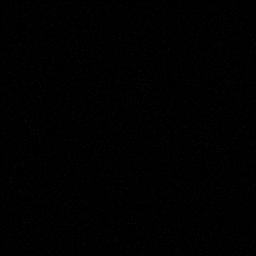

[Series 7: PD fat-sat · coronal · 4.0mm · 0.62mm/px · 6 of 31 slices shown (2 of 2)]
[im 1/31]
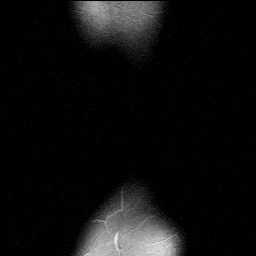
[im 7/31]
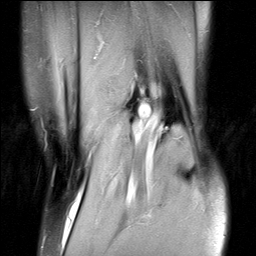
[im 13/31]
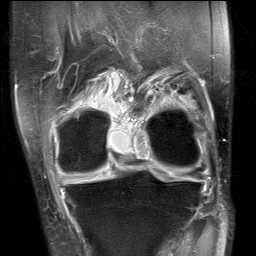
[im 19/31]
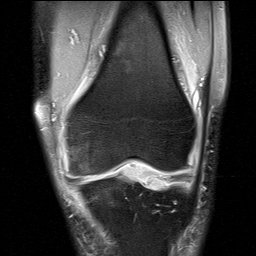
[im 25/31]
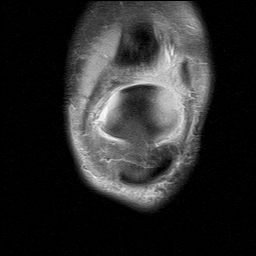
[im 31/31]
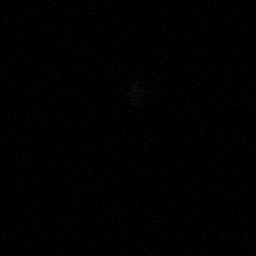

[Series 8: T2 fat-sat · sagittal · 3.0mm · 0.62mm/px · 7 of 35 slices shown (3 of 3)]
[im 1/35]
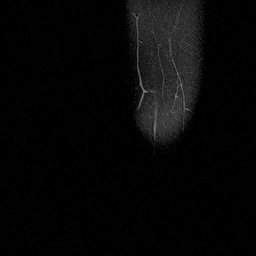
[im 6/35]
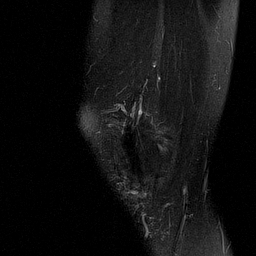
[im 12/35]
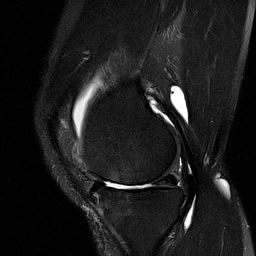
[im 18/35]
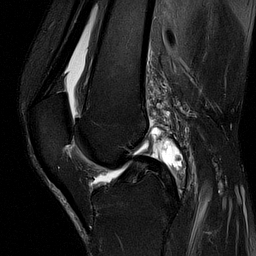
[im 23/35]
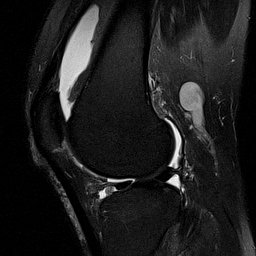
[im 29/35]
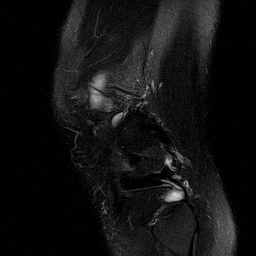
[im 35/35]
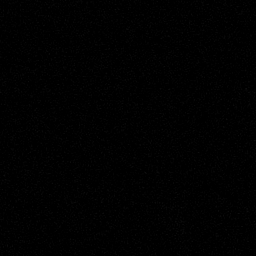

[40 of 40 positions shown; findings below may reference images not displayed]

FINDINGS: MENISCI

Medial meniscus: Extensive irregular undersurface tearing of the
medial meniscal body and posterior horn in a predominantly oblique
orientation (series 5, images 7-15). Posterior root attachment site
is ill-defined and may be partially torn (series 7, images 11-12).
Mildly extruded meniscal body.

Lateral meniscus: Intrasubstance degeneration with mild free edge
and undersurface fraying of the posterior horn.

LIGAMENTS

Cruciates: Mucoid degeneration of the ACL and PCL without tear.
Lobulated cystic structure located adjacent to the posterior margin
of the PCL measuring 2.7 x 1.2 x 1.5 cm, likely ganglion cyst.

Collaterals: Medial collateral ligament is intact. Lateral
collateral ligament complex is intact.

CARTILAGE

Patellofemoral: Subtle chondral surface irregularity along the
lateral patellar facet inferiorly. No chondral defect.

Medial: Large area of full-thickness cartilage loss involving the
weight-bearing medial femoral condyle measuring 2.5 x 1.5 cm.
High-grade chondral thinning of the medial tibial plateau where
there is full-thickness chondral loss of the more peripheral and
anterior aspects.

Lateral:  No chondral defect.

Joint: Moderate-sized knee joint effusion. Fat pads within normal
limits.

Popliteal Fossa: Small Baker's cyst containing 7 mm loose body
inferiorly. Small volume semimembranosus-tibial collateral ligament
bursal fluid. Intact popliteus tendon.

Extensor Mechanism: Intact quadriceps tendon and patellar tendon.
Mild proximal patellar tendinosis.

Bones: Bone marrow edema within the medial femoral condyle and
medial tibial plateau without well-defined subchondral fracture
line. Tibiofemoral compartment joint space narrowing with marginal
osteophyte formation. No suspicious bone lesion.

Other: None.
IMPRESSION: 1. Tricompartmental osteoarthritis of the left knee, moderate to
severe within the medial compartment. Large area of full-thickness
cartilage loss involving the weight-bearing medial femoral condyle
and medial tibial plateau.
2. Complex, degenerative tearing of the medial meniscal body and
posterior horn. Posterior root attachment site is ill-defined and
may be partially torn.
3. Intrasubstance degeneration with mild free edge and undersurface
fraying of the posterior horn of the lateral meniscus.
4. Bone marrow edema within the medial femoral condyle and medial
tibial plateau without well-defined subchondral fracture line.
5. Moderate-sized knee joint effusion. Small Baker's cyst containing
7 mm loose body inferiorly.
6. Mild proximal patellar tendinosis.
